# Patient Record
Sex: Male | Born: 1965 | Race: Black or African American | Hispanic: No | Marital: Married | Smoking: Never smoker
Health system: Southern US, Community
[De-identification: ages and names within clinical notes are randomized; demographics above are authoritative.]

## PROBLEM LIST (undated history)

## (undated) DIAGNOSIS — G4730 Sleep apnea, unspecified: Secondary | ICD-10-CM

## (undated) DIAGNOSIS — G47419 Narcolepsy without cataplexy: Secondary | ICD-10-CM

## (undated) DIAGNOSIS — F4310 Post-traumatic stress disorder, unspecified: Secondary | ICD-10-CM

## (undated) DIAGNOSIS — G589 Mononeuropathy, unspecified: Secondary | ICD-10-CM

## (undated) DIAGNOSIS — S149XXA Injury of unspecified nerves of neck, initial encounter: Secondary | ICD-10-CM

## (undated) HISTORY — PX: HIP SURGERY: SHX245

---

## 1998-06-20 ENCOUNTER — Emergency Department (HOSPITAL_COMMUNITY): Admission: EM | Admit: 1998-06-20 | Discharge: 1998-06-20 | Payer: Self-pay

## 2002-04-19 ENCOUNTER — Encounter: Admission: RE | Admit: 2002-04-19 | Discharge: 2002-04-19 | Payer: Self-pay | Admitting: Internal Medicine

## 2002-04-19 ENCOUNTER — Encounter: Payer: Self-pay | Admitting: Internal Medicine

## 2010-10-17 ENCOUNTER — Encounter
Admission: RE | Admit: 2010-10-17 | Discharge: 2010-11-22 | Payer: Self-pay | Source: Home / Self Care | Attending: Orthopedic Surgery | Admitting: Orthopedic Surgery

## 2015-07-19 DIAGNOSIS — N529 Male erectile dysfunction, unspecified: Secondary | ICD-10-CM | POA: Insufficient documentation

## 2015-07-19 DIAGNOSIS — N400 Enlarged prostate without lower urinary tract symptoms: Secondary | ICD-10-CM | POA: Insufficient documentation

## 2016-07-11 DIAGNOSIS — M722 Plantar fascial fibromatosis: Secondary | ICD-10-CM | POA: Insufficient documentation

## 2018-09-16 ENCOUNTER — Other Ambulatory Visit: Payer: Self-pay

## 2018-09-16 ENCOUNTER — Emergency Department (HOSPITAL_BASED_OUTPATIENT_CLINIC_OR_DEPARTMENT_OTHER)
Admission: EM | Admit: 2018-09-16 | Discharge: 2018-09-16 | Disposition: A | Discharge status: Home / Self Care | Payer: Federal, State, Local not specified - PPO | Attending: Emergency Medicine | Admitting: Emergency Medicine

## 2018-09-16 ENCOUNTER — Encounter (HOSPITAL_BASED_OUTPATIENT_CLINIC_OR_DEPARTMENT_OTHER): Payer: Self-pay

## 2018-09-16 ENCOUNTER — Emergency Department (HOSPITAL_BASED_OUTPATIENT_CLINIC_OR_DEPARTMENT_OTHER): Payer: Federal, State, Local not specified - PPO

## 2018-09-16 DIAGNOSIS — R079 Chest pain, unspecified: Secondary | ICD-10-CM | POA: Diagnosis present

## 2018-09-16 DIAGNOSIS — Z79899 Other long term (current) drug therapy: Secondary | ICD-10-CM | POA: Insufficient documentation

## 2018-09-16 DIAGNOSIS — R0602 Shortness of breath: Secondary | ICD-10-CM | POA: Diagnosis not present

## 2018-09-16 DIAGNOSIS — R0789 Other chest pain: Secondary | ICD-10-CM

## 2018-09-16 HISTORY — DX: Narcolepsy without cataplexy: G47.419

## 2018-09-16 HISTORY — DX: Injury of unspecified nerves of neck, initial encounter: S14.9XXA

## 2018-09-16 HISTORY — DX: Post-traumatic stress disorder, unspecified: F43.10

## 2018-09-16 HISTORY — DX: Mononeuropathy, unspecified: G58.9

## 2018-09-16 HISTORY — DX: Sleep apnea, unspecified: G47.30

## 2018-09-16 LAB — CBC
HCT: 42.4 % (ref 39.0–52.0)
Hemoglobin: 13.4 g/dL (ref 13.0–17.0)
MCH: 29.4 pg (ref 26.0–34.0)
MCHC: 31.6 g/dL (ref 30.0–36.0)
MCV: 93 fL (ref 80.0–100.0)
NRBC: 0 % (ref 0.0–0.2)
PLATELETS: 268 10*3/uL (ref 150–400)
RBC: 4.56 MIL/uL (ref 4.22–5.81)
RDW: 12.3 % (ref 11.5–15.5)
WBC: 8.5 10*3/uL (ref 4.0–10.5)

## 2018-09-16 LAB — TROPONIN I
Troponin I: <0.03 ng/mL (ref <0.03)
Troponin I: <0.03 ng/mL (ref <0.03)

## 2018-09-16 LAB — BASIC METABOLIC PANEL
Anion gap: 8 (ref 5–15)
BUN: 16 mg/dL (ref 6–20)
CALCIUM: 9.5 mg/dL (ref 8.9–10.3)
CO2: 30 mmol/L (ref 22–32)
CREATININE: 1.09 mg/dL (ref 0.61–1.24)
Chloride: 101 mmol/L (ref 98–111)
Glucose, Bld: 117 mg/dL — ABNORMAL HIGH (ref 70–99)
Potassium: 3.5 mmol/L (ref 3.5–5.1)
Sodium: 139 mmol/L (ref 135–145)

## 2018-09-16 LAB — PROTIME-INR
INR: 1.01
Prothrombin Time: 13.2 seconds (ref 11.4–15.2)

## 2018-09-16 LAB — D-DIMER, QUANTITATIVE: D-Dimer, Quant: 0.34 ug/mL-FEU (ref 0.00–0.50)

## 2018-09-16 LAB — BRAIN NATRIURETIC PEPTIDE: B Natriuretic Peptide: 21.2 pg/mL (ref 0.0–100.0)

## 2018-09-16 NOTE — ED Notes (Signed)
ED Provider at bedside. 

## 2018-09-16 NOTE — ED Triage Notes (Signed)
CP 3 days ago-SOB has cont's-NAD-steady gait

## 2018-09-16 NOTE — ED Notes (Signed)
Nurse first spot check spo2 99%, HR 90. Pt denies any cp at this time, reports sob earlier today, and cp on Saturday.

## 2018-09-16 NOTE — ED Notes (Signed)
Pt denies chest pain at present. Skin warm and dry. NAD

## 2018-09-16 NOTE — Discharge Instructions (Signed)
Follow-up with the cardiologist provided.  Return here for any worsening in your condition.

## 2018-09-17 DIAGNOSIS — H40003 Preglaucoma, unspecified, bilateral: Secondary | ICD-10-CM | POA: Insufficient documentation

## 2018-09-17 DIAGNOSIS — H353290 Exudative age-related macular degeneration, unspecified eye, stage unspecified: Secondary | ICD-10-CM | POA: Insufficient documentation

## 2018-09-17 DIAGNOSIS — H35351 Cystoid macular degeneration, right eye: Secondary | ICD-10-CM | POA: Insufficient documentation

## 2018-09-17 DIAGNOSIS — H35719 Central serous chorioretinopathy, unspecified eye: Secondary | ICD-10-CM | POA: Insufficient documentation

## 2018-09-17 DIAGNOSIS — H3090 Unspecified chorioretinal inflammation, unspecified eye: Secondary | ICD-10-CM | POA: Insufficient documentation

## 2018-09-17 NOTE — ED Provider Notes (Signed)
[facility] EMERGENCY DEPARTMENT Provider Note   CSN: [id number] Arrival date & time: 09/16/18  1821     History   Chief Complaint Chief Complaint  Patient presents with  . Chest Pain    HPI Gary Shepard is a 52 y.o. male.  HPI Patient presents to the emergency department with chest pain 3 days ago that has resolved but then had some shortness of breath noted over the last 2 days.  The patient states that nothing seems to make the condition better or worse.  The patient states that he did not take any medications prior to arrival for symptoms.  Patient states he is no known cardiac disease.  Patient states that the pain was constant for several hours.  Patient states that when he was talking to his sister on the phone she was like wise you seem like you are out of breath and the patient states he did not realize that he was.  The patient denies  headache,blurred vision, neck pain, fever, cough, weakness, numbness, dizziness, anorexia, edema, abdominal pain, nausea, vomiting, diarrhea, rash, back pain, dysuria, hematemesis, bloody stool, near syncope, or syncope. Past Medical History:  Diagnosis Date  . Narcolepsy   . Pinched nerve in neck   . PTSD (post-traumatic stress disorder)   . Sleep apnea     Patient Active Problem List   Diagnosis Date Noted  . Central serous retinopathy 09/17/2018  . CME (cystoid macular edema), right 09/17/2018  . 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 Glaucoma suspect of both eyes 09/17/2018  . Retinitis 09/17/2018  . Plantar fasciitis of left foot 07/11/2016  . BPH without urinary obstruction 07/19/2015  . ED (erectile dysfunction) of organic origin 07/19/2015    Past Surgical History:  Procedure Laterality Date  . HIP SURGERY          Home Medications    Prior to Admission medications   Medication Sig Start Date End Date Taking? Authorizing Provider  albuterol (PROVENTIL HFA;VENTOLIN HFA) 108 (90  Base) MCG/ACT inhaler Inhale 2 puffs into the lungs as needed. 04/05/18   [provider]  azelastine (ASTELIN) 0.1 % nasal spray Place 1 spray into both nostrils as needed. 07/11/18   [provider]  carbamazepine (CARBATROL) 100 MG 12 hr capsule Take 1 capsule by mouth every 12 (twelve) hours.    [provider]  citalopram (CELEXA) 40 MG tablet Take 1 tablet by mouth daily.    [provider]  frovatriptan (FROVA) 2.5 MG tablet Take 1 tablet by mouth as needed.    [provider]  ibuprofen (ADVIL,MOTRIN) 800 MG tablet Take 1 tablet by mouth 3 (three) times daily.    [provider]  meloxicam (MOBIC) 15 MG tablet Take 1 tablet by mouth daily. 08/07/18   [provider]  modafinil (PROVIGIL) 200 MG tablet Take 1 tablet by mouth 2 (two) times daily. 03/15/11   [provider]  propranolol (INDERAL) 10 MG tablet Take 1 tablet by mouth daily. 03/15/11   [provider]  sertraline (ZOLOFT) 100 MG tablet Take 1 tablet by mouth daily. 03/15/11   [provider]  SUMAtriptan (IMITREX) 25 MG tablet Take 1 tablet by mouth daily. 03/15/11   [provider]  tadalafil (CIALIS) 5 MG tablet Take 1 tablet by mouth daily. 09/02/18   [provider]  terazosin (HYTRIN) 2 MG capsule Take 1 capsule by mouth daily. 03/15/11   [provider]  valACYclovir Ralph Dowdy)  500 MG tablet Take 1 tablet by mouth as needed. 03/15/11   [provider]  Vardenafil HCl 10 MG TBDP Take 1 tablet by mouth daily.    [provider]    Family History No family history on file.  Social History Social History   Tobacco Use  . Smoking status: Never Smoker  . Smokeless tobacco: Never Used  Substance Use Topics  . Alcohol use: Never    Frequency: Never  . Drug use: Never     Allergies   Doxycycline and Prednisone   Review of Systems Review of Systems All other systems negative except as  documented in the HPI. All pertinent positives and negatives as reviewed in the HPI.  Physical Exam Updated Vital Signs BP 106/66   Pulse (!) 58   Temp 98.5 F (36.9 C) (Oral)   Resp 14   Ht 5\' 10"  (1.778 m)   Wt 87.1 kg   SpO2 100%   BMI 27.55 kg/m   Physical Exam  Constitutional: He is oriented to person, place, and time. He appears well-developed and well-nourished. No distress.  HENT:  Head: Normocephalic and atraumatic.  Mouth/Throat: Oropharynx is clear and moist.  Eyes: Pupils are equal, round, and reactive to light.  Neck: Normal range of motion. Neck supple.  Cardiovascular: Normal rate, regular rhythm and normal heart sounds. Exam reveals no gallop and no friction rub.  No murmur heard. Pulmonary/Chest: Effort normal and breath sounds normal. No respiratory distress. He has no decreased breath sounds. He has no wheezes. He has no rhonchi. He has no rales.  Abdominal: Soft. Bowel sounds are normal. He exhibits no distension. There is no tenderness.  Neurological: He is alert and oriented to person, place, and time. He exhibits normal muscle tone. Coordination normal.  Skin: Skin is warm and dry. Capillary refill takes less than 2 seconds. No rash noted. No erythema.  Psychiatric: He has a normal mood and affect. His behavior is normal.  Nursing note and vitals reviewed.    ED Treatments / Results  Labs (all labs ordered are listed, but only abnormal results are displayed) Labs Reviewed  BASIC METABOLIC PANEL - Abnormal; Notable for the following components:      Result Value   Glucose, Bld 117 (*)    All other components within normal limits  CBC  TROPONIN I  PROTIME-INR  BRAIN NATRIURETIC PEPTIDE  D-DIMER, QUANTITATIVE (NOT AT [facility])  TROPONIN I    EKG EKG Interpretation  Date/Time:  Monday September 16 2018 18:30:14 EDT Ventricular Rate:  99 PR Interval:  154 QRS Duration: 74 QT Interval:  326 QTC Calculation: 418 R Axis:   -23 Text Interpretation:   Normal sinus rhythm Normal ECG No acute changes Confirmed by Derwood Kaplan ([id number]) on 09/17/2018 3:33:56 PM   Radiology Dg Chest 2 View  Result Date: 09/16/2018 CLINICAL DATA:  52 y/o M; left-sided chest pain and shortness of breath. EXAM: CHEST - 2 VIEW COMPARISON:  None. FINDINGS: The heart size and mediastinal contours are within normal limits. Both lungs are clear. The visualized skeletal structures are unremarkable. IMPRESSION: No acute pulmonary process identified. Electronically Signed   By: Mitzi Hansen M.D.   On: 09/16/2018 23:24    Procedures Procedures (including critical care time)  Medications Ordered in ED Medications - No data to display   Initial Impression / Assessment and Plan / ED Course  I have reviewed the triage vital signs and the nursing notes.  Pertinent labs & imaging results  that were available during my care of the patient were reviewed by me and considered in my medical decision making (see chart for details).     The patient has atypical symptoms for cardiac chest pain but I did advise him that we will need to have him follow-up with the cardiologist.  As we cannot totally exclude a cardiac etiology for his symptoms.  The patient is advised to return for any worsening his condition.  There was consideration for pulmonary embolism but he had a negative d-dimer.  The patient had negative troponins.  And the chest x-ray did not show any abnormality.  These were reviewed by me.  I advised the patient of the findings and all questions were answered.  Final Clinical Impressions(s) / ED Diagnoses   Final diagnoses:  Atypical chest pain    ED Discharge Orders    None       Charlestine Night, PA-C 09/17/18 2353    Gwyneth Sprout, MD 09/18/18 [phone]

## 2018-09-27 ENCOUNTER — Ambulatory Visit: Payer: Federal, State, Local not specified - PPO | Admitting: Cardiology

## 2018-10-09 ENCOUNTER — Ambulatory Visit: Payer: Federal, State, Local not specified - PPO | Admitting: Cardiology

## 2018-10-18 ENCOUNTER — Encounter

## 2018-10-18 ENCOUNTER — Ambulatory Visit: Payer: Federal, State, Local not specified - PPO | Admitting: Cardiology

## 2018-10-18 ENCOUNTER — Encounter: Payer: Self-pay | Admitting: Cardiology

## 2018-10-18 DIAGNOSIS — R0789 Other chest pain: Secondary | ICD-10-CM | POA: Diagnosis not present

## 2018-10-18 NOTE — Progress Notes (Signed)
Cardiology Office Note:    Date:  10/18/2018   ID:  Gary Mulberryarren K Oliveira, DOB 1965-12-23, MRN [id number]  PCP:  Patient, No Pcp Per  Cardiologist:  Garwin Brothersajan R Leola Fiore, MD   Referring MD: Gwyneth SproutPlunkett, Whitney, MD    ASSESSMENT:    1. Chest discomfort    PLAN:    In order of problems listed above:  1. Primary prevention stressed with the patient.  Importance of compliance with diet and medication stressed and he vocalized understanding.  His blood pressure is stable.  Diet was discussed good health.  He tells me that he will have blood work report to me when he comes for a stress echocardiogram.  Stress echocardiogram will be done to assess chest pain and to reassure him to step up his exercise program.  He is already started walking a mile a day without any problem.  Patient will be seen in follow-up appointment on a as needed basis only.   Medication Adjustments/Labs and Tests Ordered: Current medicines are reviewed at length with the patient today.  Concerns regarding medicines are outlined above.  No orders of the defined types were placed in this encounter.  No orders of the defined types were placed in this encounter.    History of Present Illness:    Gary Shepard is a 52 y.o. male who is being seen today for the evaluation of chest discomfort initially at the request of Gwyneth SproutPlunkett, Whitney, MD.  Patient is a pleasant 52 year old male.  He has no significant past medical history.  He denies any history of hypertension diabetes mellitus dyslipidemia or any smoking history.  He mentions to me that he had a sharp pain in the chest and therefore he was uncomfortable and felt that he had some breathing issues so he went to the emergency room and was evaluated and discharged.  His EKG there was unremarkable.  I reviewed emergency room notes and follow-up for evaluation.  At the time of my evaluation, the patient is alert awake oriented and in no distress.  The pain did not radiate to the neck or  to the arms.  Past Medical History:  Diagnosis Date  . Narcolepsy   . Pinched nerve in neck   . PTSD (post-traumatic stress disorder)   . Sleep apnea     Past Surgical History:  Procedure Laterality Date  . HIP SURGERY      Current Medications: Current Meds  Medication Sig  . albuterol (PROVENTIL HFA;VENTOLIN HFA) 108 (90 Base) MCG/ACT inhaler Inhale 2 puffs into the lungs as needed.  Marland Kitchen. azelastine (ASTELIN) 0.1 % nasal spray Place 1 spray into both nostrils as needed.  . carbamazepine (CARBATROL) 100 MG 12 hr capsule Take 1 capsule by mouth every 12 (twelve) hours.  . citalopram (CELEXA) 40 MG tablet Take 1 tablet by mouth daily.  . frovatriptan (FROVA) 2.5 MG tablet Take 1 tablet by mouth as needed.  Marland Kitchen. ibuprofen (ADVIL,MOTRIN) 800 MG tablet Take 1 tablet by mouth 3 (three) times daily.  . meloxicam (MOBIC) 15 MG tablet Take 1 tablet by mouth as needed.   . modafinil (PROVIGIL) 200 MG tablet Take 1 tablet by mouth 2 (two) times daily.  . propranolol (INDERAL) 10 MG tablet Take 1 tablet by mouth daily.  . sertraline (ZOLOFT) 100 MG tablet Take 1 tablet by mouth daily.  . SUMAtriptan (IMITREX) 25 MG tablet Take 1 tablet by mouth daily.  . tadalafil (CIALIS) 5 MG tablet Take 1 tablet by mouth daily.  .Marland Kitchen  terazosin (HYTRIN) 2 MG capsule Take 1 capsule by mouth daily.  . valACYclovir (VALTREX) 500 MG tablet Take 1 tablet by mouth as needed.  . Vardenafil HCl 10 MG TBDP Take 1 tablet by mouth daily.     Allergies:   Doxycycline and Prednisone   Social History   Socioeconomic History  . Marital status: Married    Spouse name: Not on file  . Number of children: Not on file  . Years of education: Not on file  . Highest education level: Not on file  Occupational History  . Not on file  Social Needs  . Financial resource strain: Not on file  . Food insecurity:    Worry: Not on file    Inability: Not on file  . Transportation needs:    Medical: Not on file    Non-medical: Not  on file  Tobacco Use  . Smoking status: Never Smoker  . Smokeless tobacco: Never Used  Substance and Sexual Activity  . Alcohol use: Never    Frequency: Never  . Drug use: Never  . Sexual activity: Not on file  Lifestyle  . Physical activity:    Days per week: Not on file    Minutes per session: Not on file  . Stress: Not on file  Relationships  . Social connections:    Talks on phone: Not on file    Gets together: Not on file    Attends religious service: Not on file    Active member of club or organization: Not on file    Attends meetings of clubs or organizations: Not on file    Relationship status: Not on file  Other Topics Concern  . Not on file  Social History Narrative  . Not on file     Family History: The patient's family history is not on file.  ROS:   Please see the history of present illness.    All other systems reviewed and are negative.  EKGs/Labs/Other Studies Reviewed:    The following studies were reviewed today: Emergency room records were reviewed and EKG was sinus rhythm and nonspecific ST-T changes.   Recent Labs: 09/16/2018: B Natriuretic Peptide 21.2; BUN 16; Creatinine, Ser 1.09; Hemoglobin 13.4; Platelets 268; Potassium 3.5; Sodium 139  Recent Lipid Panel No results found for: CHOL, TRIG, HDL, CHOLHDL, VLDL, LDLCALC, LDLDIRECT  Physical Exam:    VS:  BP 112/62 (BP Location: Right Arm, Patient Position: Sitting, Cuff Size: Normal)   Pulse 81   Ht 5\' 10"  (1.778 m)   Wt 188 lb (85.3 kg)   SpO2 98%   BMI 26.98 kg/m     Wt Readings from Last 3 Encounters:  10/18/18 188 lb (85.3 kg)  09/16/18 192 lb (87.1 kg)     GEN: Patient is in no acute distress HEENT: Normal NECK: No JVD; No carotid bruits LYMPHATICS: No lymphadenopathy CARDIAC: S1 S2 regular, 2/6 systolic murmur at the apex. RESPIRATORY:  Clear to auscultation without rales, wheezing or rhonchi  ABDOMEN: Soft, non-tender, non-distended MUSCULOSKELETAL:  No edema; No  deformity  SKIN: Warm and dry NEUROLOGIC:  Alert and oriented x 3 PSYCHIATRIC:  Normal affect    Signed, Garwin Brothers, MD  10/18/2018 4:07 PM    [facility]

## 2018-10-18 NOTE — Patient Instructions (Signed)
Medication Instructions:  Your physician recommends that you continue on your current medications as directed. Please refer to the Current Medication list given to you today.  If you need a refill on your cardiac medications before your next appointment, please call your pharmacy.   Lab work: None  If you have labs (blood work) drawn today and your tests are completely normal, you will receive your results only by: . MyChart Message (if you have MyChart) OR . A paper copy in the mail If you have any lab test that is abnormal or we need to change your treatment, we will call you to review the results.  Testing/Procedures: Your physician has requested that you have a stress echocardiogram. For further information please visit [email or web address]. Please follow instruction sheet as given.  Follow-Up: At [facility], you and your health needs are our priority.  As part of our continuing mission to provide you with exceptional heart care, we have created designated Provider Care Teams.  These Care Teams include your primary Cardiologist (physician) and Advanced Practice Providers (APPs -  Physician Assistants and Nurse Practitioners) who all work together to provide you with the care you need, when you need it.  You will need a follow up appointment in as needed.  Please call our office 2 months in advance to schedule this appointment.  You may see another member of our [facility] Provider Team in [place]: Robert Krasowski, MD . Brian Munley, MD  Any Other Special Instructions Will Be Listed Below (If Applicable).    

## 2018-10-29 ENCOUNTER — Ambulatory Visit (HOSPITAL_BASED_OUTPATIENT_CLINIC_OR_DEPARTMENT_OTHER)
Admission: RE | Admit: 2018-10-29 | Discharge: 2018-10-29 | Disposition: A | Discharge status: Home / Self Care | Payer: Federal, State, Local not specified - PPO | Source: Ambulatory Visit | Attending: Cardiology | Admitting: Cardiology

## 2018-10-29 DIAGNOSIS — R0789 Other chest pain: Secondary | ICD-10-CM | POA: Insufficient documentation

## 2018-10-29 NOTE — Progress Notes (Signed)
  Echocardiogram 2D Echocardiogram has been performed.  Gary Shepard Gary Shepard 10/29/2018, 2:45 PM

## 2020-07-12 IMAGING — DX DG CHEST 2V
2 series · 2 of 2 positions shown · non-contrast
Comparison: None.

CLINICAL DATA: 52 y/o M; left-sided chest pain and shortness of
breath.

EXAM:
CHEST - 2 VIEW

[chest pa]
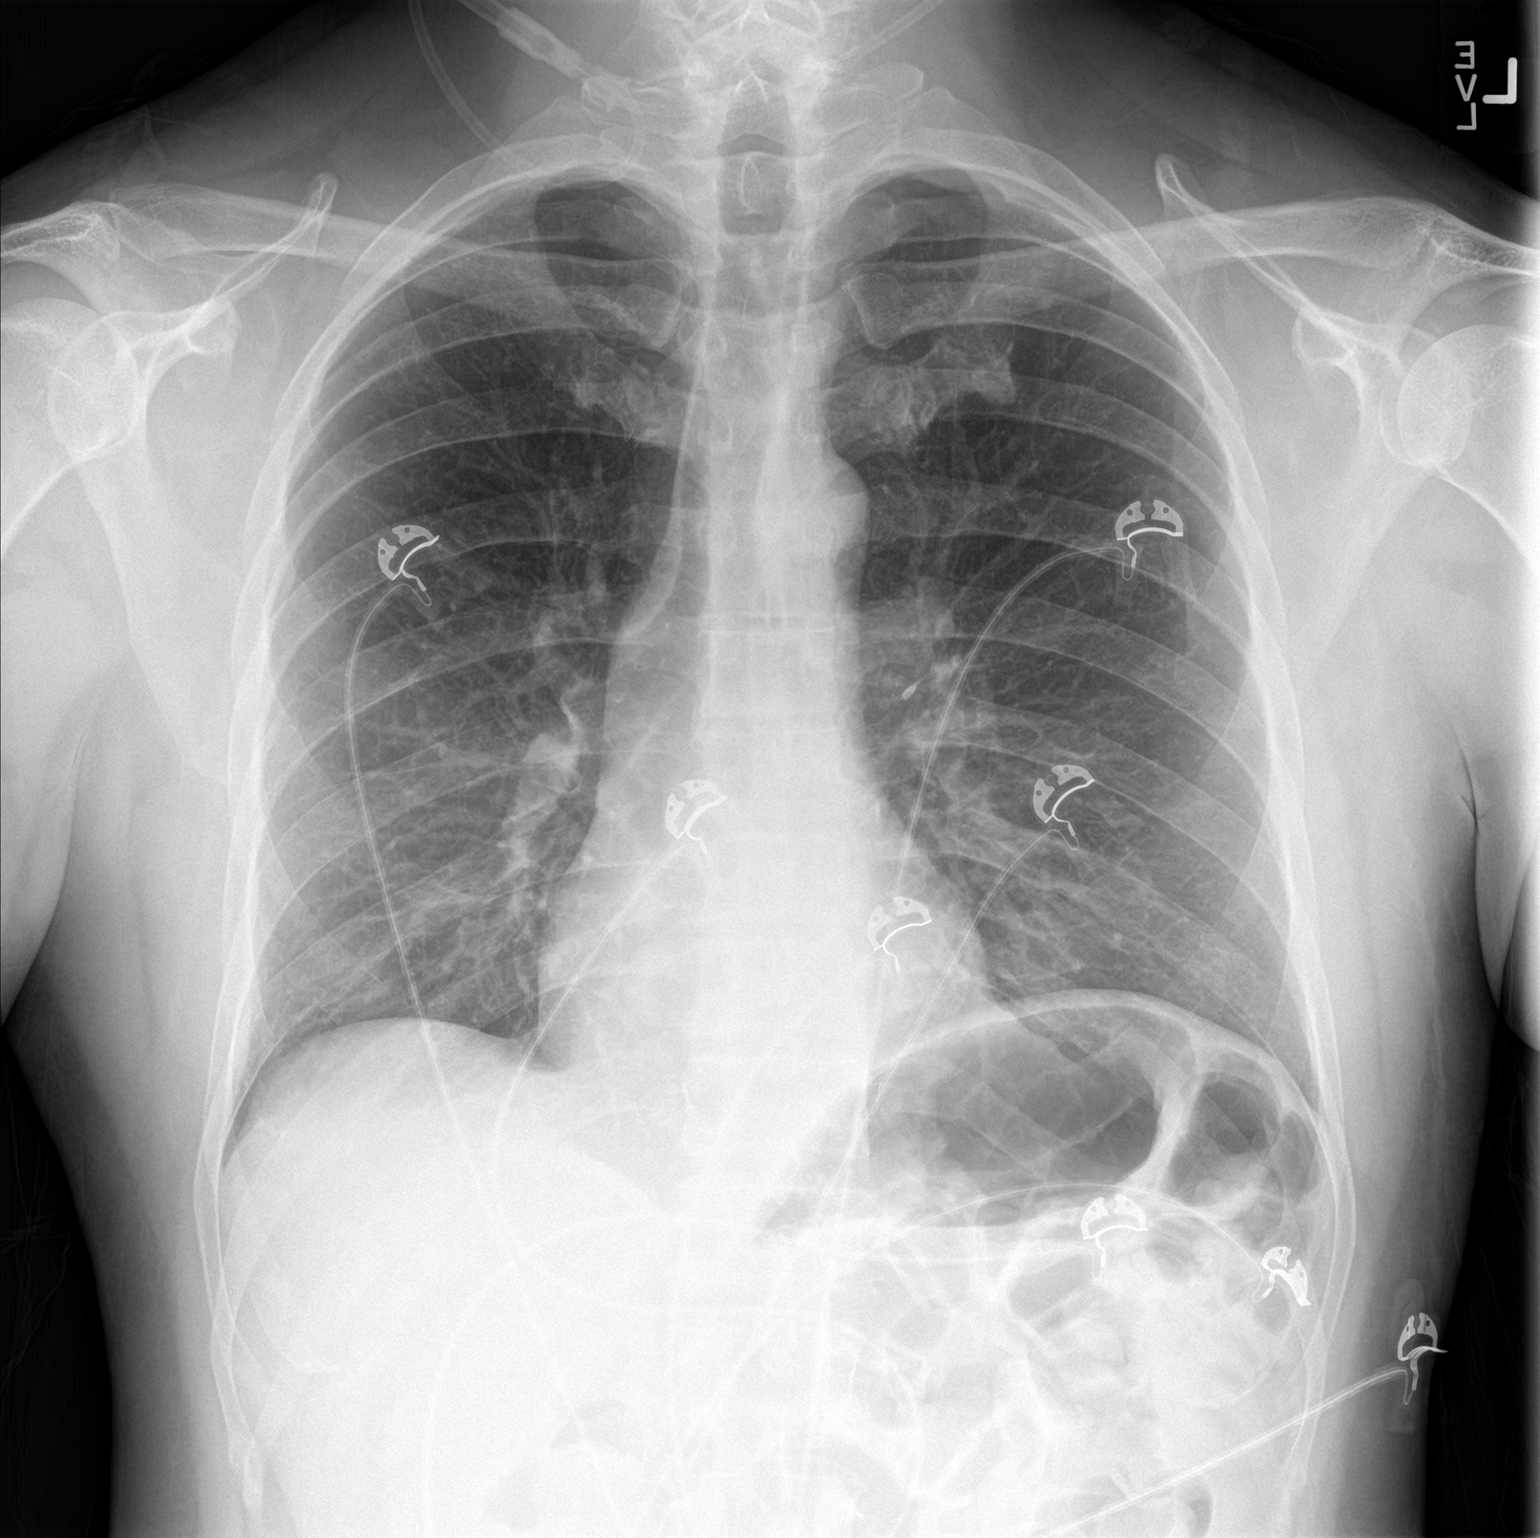

[chest lat]
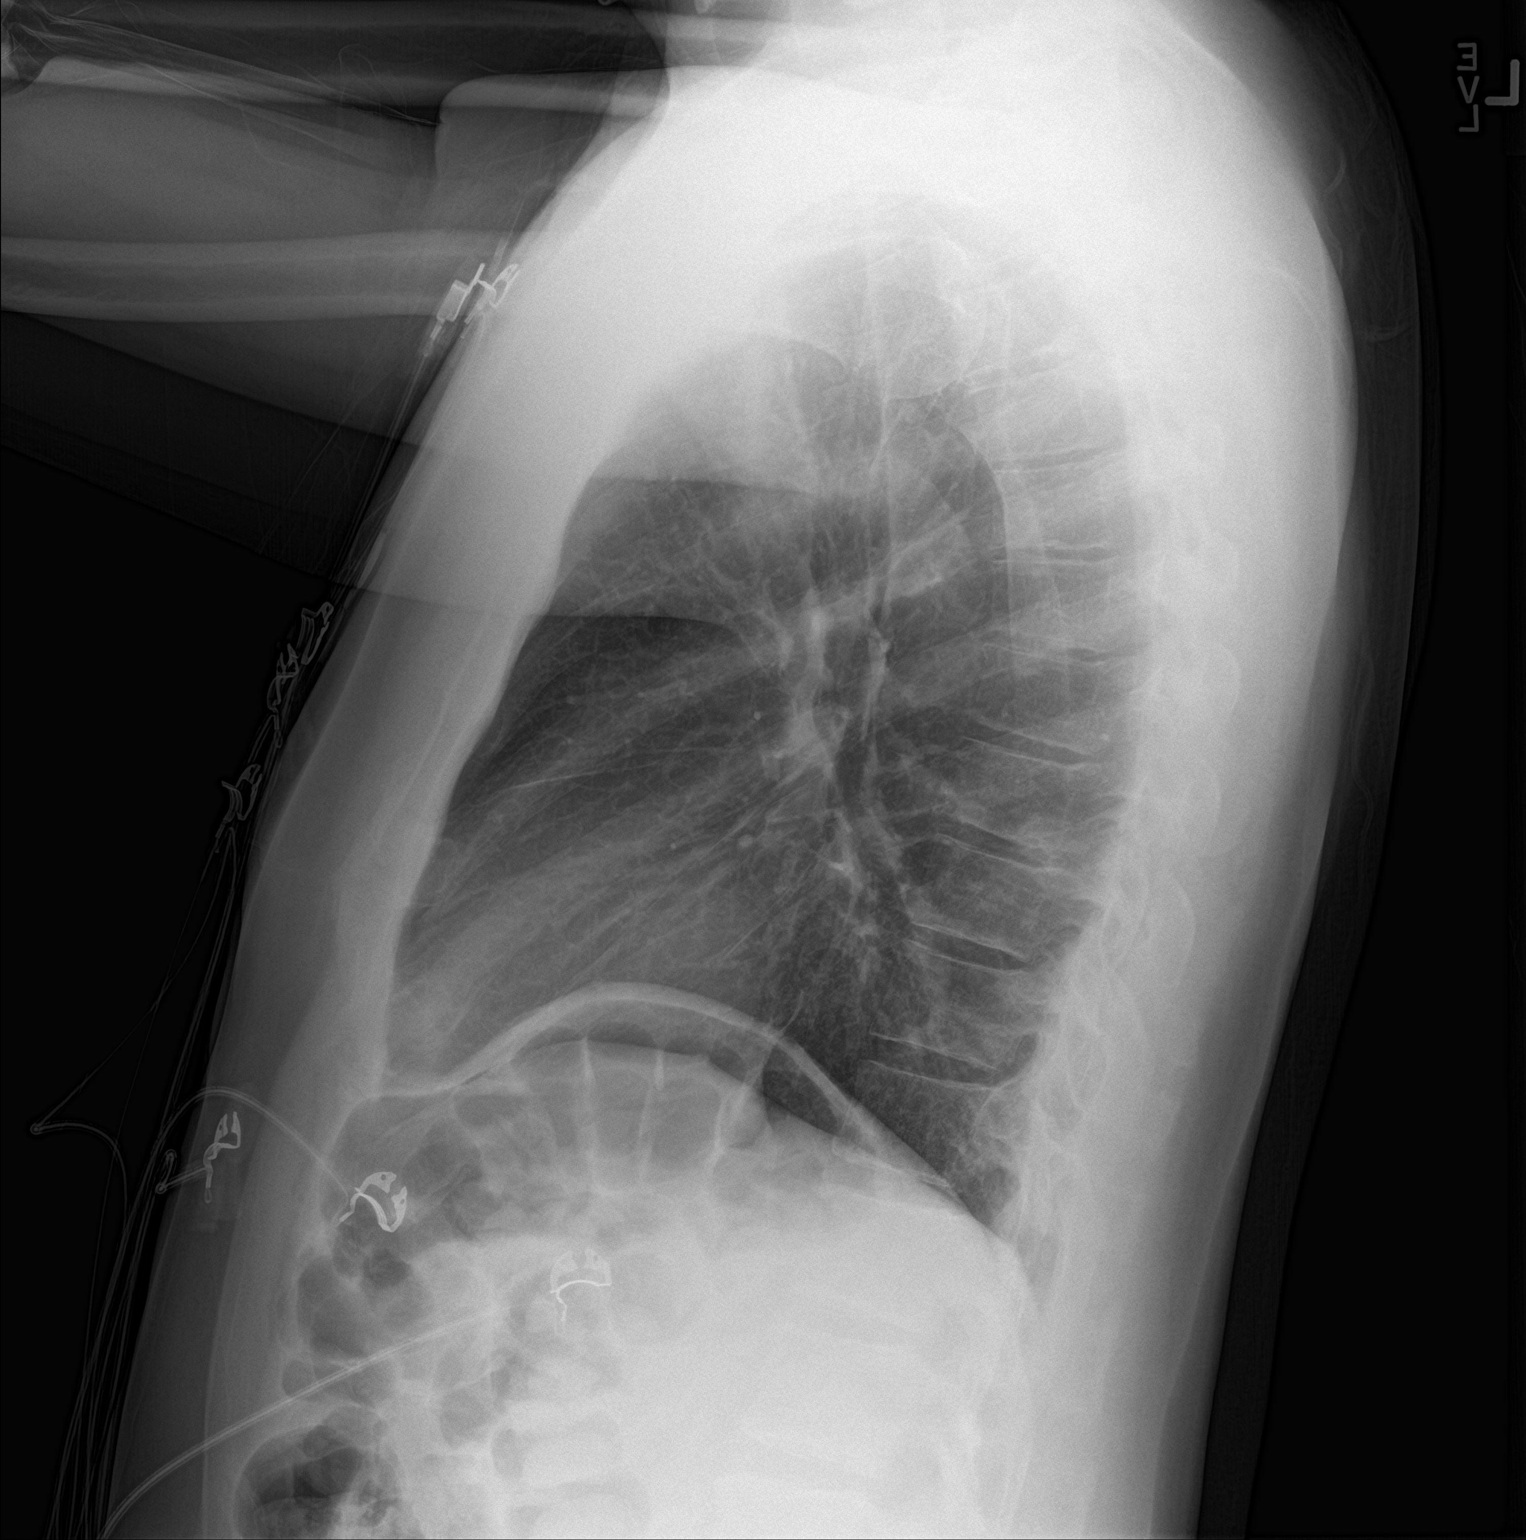

[2 of 2 positions shown; findings below may reference images not displayed]

FINDINGS: The heart size and mediastinal contours are within normal limits.
Both lungs are clear. The visualized skeletal structures are
unremarkable.
IMPRESSION: No acute pulmonary process identified.

By: Ariianit Tosuni M.D.

## 2021-08-17 ENCOUNTER — Other Ambulatory Visit (HOSPITAL_COMMUNITY): Payer: Self-pay

## 2021-08-17 MED ORDER — TESTOSTERONE CYPIONATE 100 MG/ML IM SOLN
INTRAMUSCULAR | 3 refills | Status: AC
  Filled 2021-08-17: qty 10, 84d supply, fill #0
  Filled 2021-11-30: qty 5, 84d supply, fill #1

## 2021-08-18 ENCOUNTER — Other Ambulatory Visit (HOSPITAL_COMMUNITY): Payer: Self-pay

## 2021-11-30 ENCOUNTER — Other Ambulatory Visit (HOSPITAL_BASED_OUTPATIENT_CLINIC_OR_DEPARTMENT_OTHER): Payer: Self-pay

## 2021-11-30 ENCOUNTER — Other Ambulatory Visit (HOSPITAL_COMMUNITY): Payer: Self-pay

## 2021-11-30 MED ORDER — TESTOSTERONE CYPIONATE 100 MG/ML IM SOLN
INTRAMUSCULAR | 3 refills | Status: DC
  Filled 2021-11-30: qty 10, 84d supply, fill #0
  Filled 2022-03-27: qty 10, 30d supply, fill #1

## 2022-03-15 ENCOUNTER — Other Ambulatory Visit (HOSPITAL_COMMUNITY): Payer: Self-pay

## 2022-03-27 ENCOUNTER — Other Ambulatory Visit (HOSPITAL_BASED_OUTPATIENT_CLINIC_OR_DEPARTMENT_OTHER): Payer: Self-pay

## 2022-06-09 ENCOUNTER — Other Ambulatory Visit (HOSPITAL_BASED_OUTPATIENT_CLINIC_OR_DEPARTMENT_OTHER): Payer: Self-pay

## 2022-06-12 ENCOUNTER — Other Ambulatory Visit (HOSPITAL_BASED_OUTPATIENT_CLINIC_OR_DEPARTMENT_OTHER): Payer: Self-pay

## 2022-06-12 MED ORDER — TESTOSTERONE CYPIONATE 100 MG/ML IM SOLN
INTRAMUSCULAR | 3 refills | Status: AC
  Filled 2022-06-12: qty 10, 30d supply, fill #0

## 2022-06-21 ENCOUNTER — Other Ambulatory Visit (HOSPITAL_BASED_OUTPATIENT_CLINIC_OR_DEPARTMENT_OTHER): Payer: Self-pay
